# Patient Record
Sex: Female | Born: 2000 | Race: Black or African American | Hispanic: No | Marital: Single | Smoking: Never smoker
Health system: Southern US, Community
[De-identification: ages and names within clinical notes are randomized; demographics above are authoritative.]

## PROBLEM LIST (undated history)

## (undated) DIAGNOSIS — S83501A Sprain of unspecified cruciate ligament of right knee, initial encounter: Secondary | ICD-10-CM

---
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
## 2020-11-05 IMAGING — MR MR KNEE*R* W/O CM
7 series · 40 of 40 positions shown · non-contrast
Comparison: None.

CLINICAL DATA: Right knee pain and swelling after basketball injury
5 days ago

EXAM:
MRI OF THE RIGHT KNEE WITHOUT CONTRAST
TECHNIQUE: Multiplanar, multisequence MR imaging of the knee was performed. No
intravenous contrast was administered.

[Series 3: T2 fat-sat · axial · 4.0mm · 0.53mm/px · z∈[-61,+108]mm · 6 of 35 slices shown (1 of 3)]
[im 1/35]
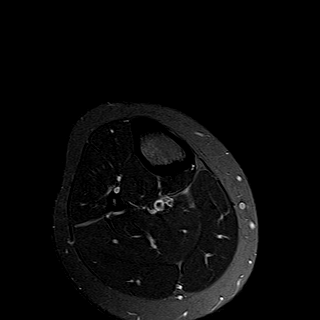
[im 7/35]
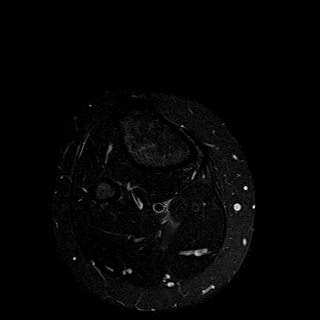
[im 14/35]
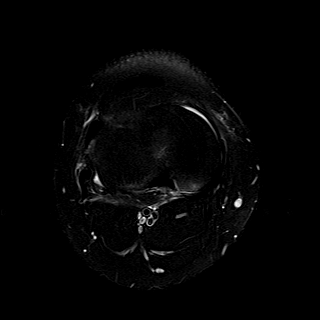
[im 21/35]
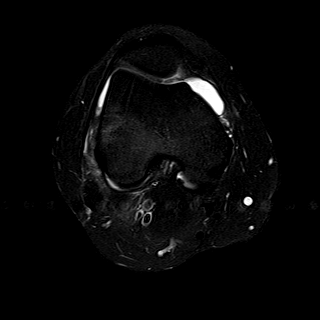
[im 28/35]
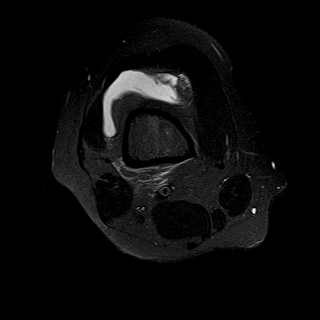
[im 35/35]
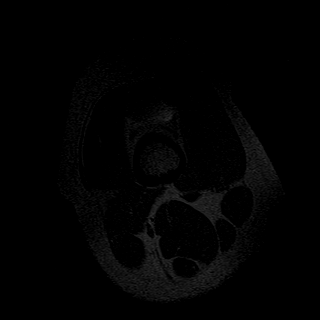

[Series 4: T1 · coronal · 4.0mm · 0.62mm/px · 6 of 31 slices shown]
[im 1/31]
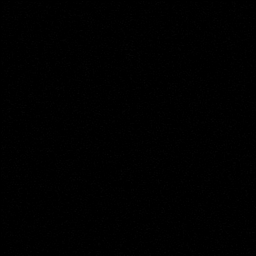
[im 7/31]
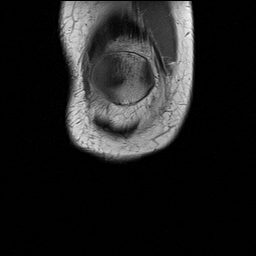
[im 13/31]
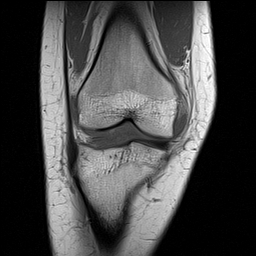
[im 19/31]
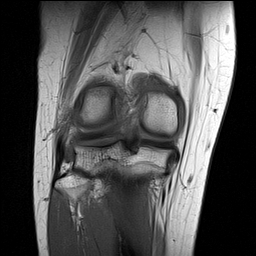
[im 25/31]
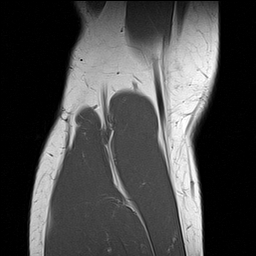
[im 31/31]
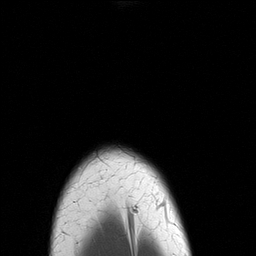

[Series 5: T2 fat-sat · coronal · 4.0mm · 0.62mm/px · 6 of 31 slices shown (2 of 3)]
[im 1/31]
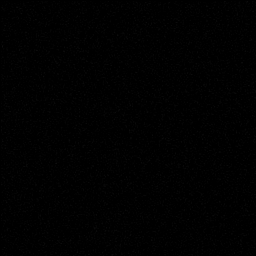
[im 7/31]
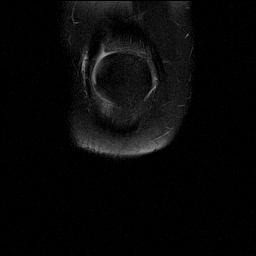
[im 13/31]
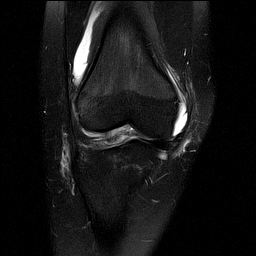
[im 19/31]
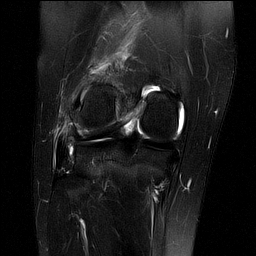
[im 25/31]
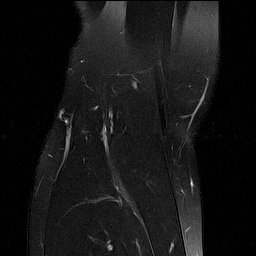
[im 31/31]
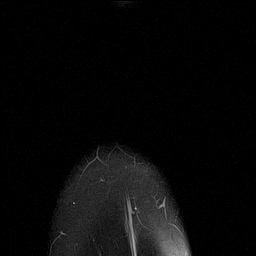

[Series 6: PD fat-sat · coronal · 4.0mm · 0.62mm/px · 6 of 31 slices shown (1 of 3)]
[im 1/31]
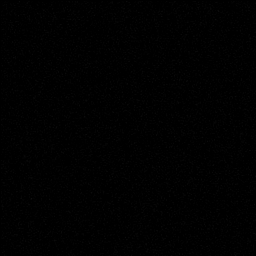
[im 7/31]
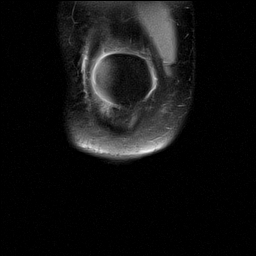
[im 13/31]
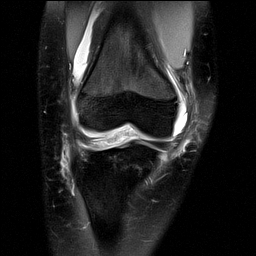
[im 19/31]
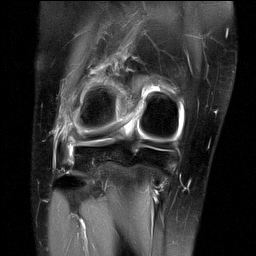
[im 25/31]
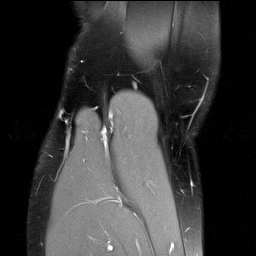
[im 31/31]
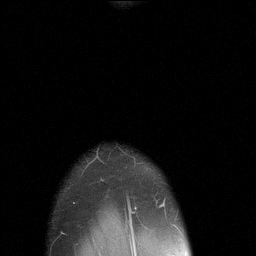

[Series 7: PD fat-sat · sagittal · 3.0mm · 0.62mm/px · 6 of 34 slices shown (2 of 3)]
[im 1/34]
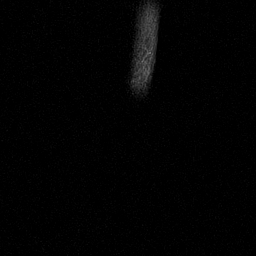
[im 7/34]
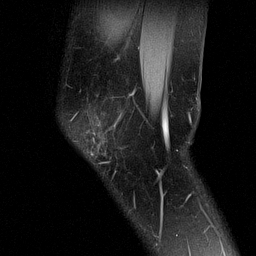
[im 14/34]
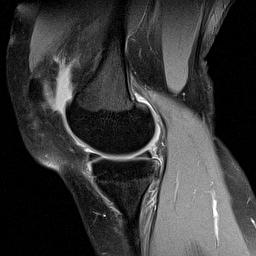
[im 20/34]
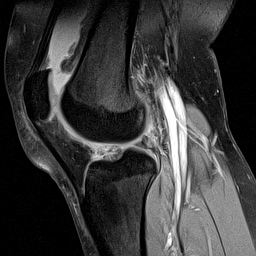
[im 27/34]
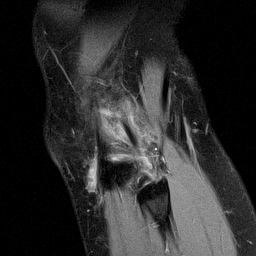
[im 34/34]
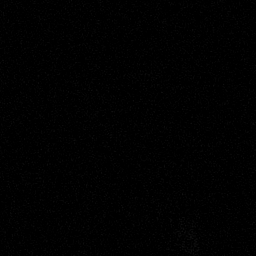

[Series 8: T2 fat-sat · sagittal · 3.0mm · 0.62mm/px · 6 of 34 slices shown (3 of 3)]
[im 1/34]
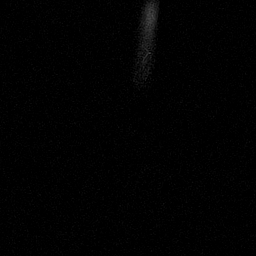
[im 7/34]
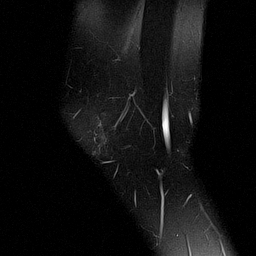
[im 14/34]
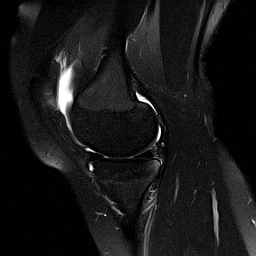
[im 20/34]
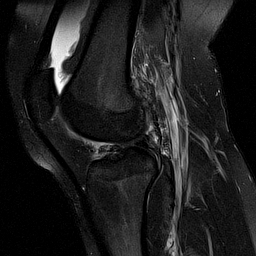
[im 27/34]
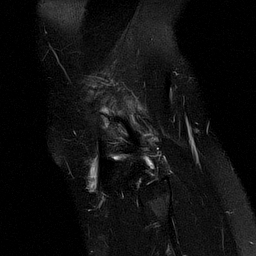
[im 34/34]
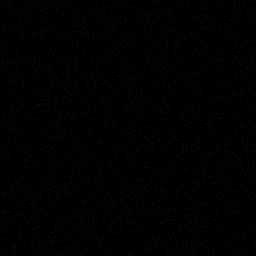

[Series 9: PD fat-sat · oblique · 2.0mm · 0.62mm/px · 4 of 19 slices shown (3 of 3)]
[im 1/19]
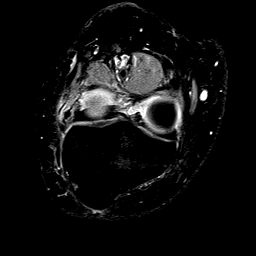
[im 7/19]
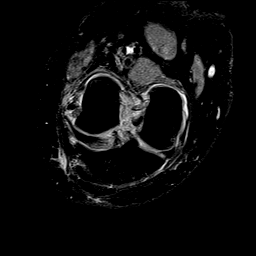
[im 13/19]
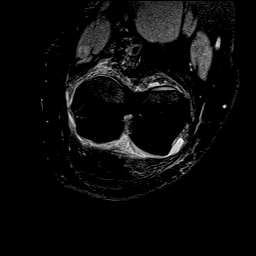
[im 19/19]
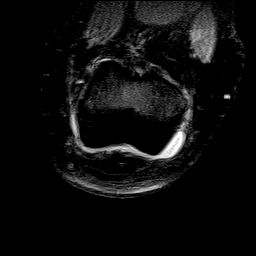

[40 of 40 positions shown; findings below may reference images not displayed]

FINDINGS: MENISCI

Medial meniscus:  Intact.

Lateral meniscus:  Intact.

LIGAMENTS

Cruciates:  Complete midsubstance rupture of the ACL. Intact PCL.

Collaterals: Thickened, irregular appearance of the fibular
collateral ligament at its femoral attachment with partial thickness
tearing. Lateral collateral ligament complex is otherwise intact.
Intact MCL.

CARTILAGE

Patellofemoral:  No chondral defect.

Medial:  No chondral defect.

Lateral:  No chondral defect.

Joint:  Large knee joint effusion. Edema within Hoffa's fat.

Popliteal Fossa:  No Baker cyst. Intact popliteus tendon.

Extensor Mechanism: Intact quadriceps tendon and patellar tendon.
Mild proximal and distal patellar tendinosis.

Bones: Pivot shift bone marrow contusions of the lateral femoral
condyle and posterior lateral tibial plateau. There is also mild
focal edema at the posterior aspect medial tibial plateau. No
fracture is seen. No dislocation. No suspicious bone lesion.

Other: None.
IMPRESSION: 1. Complete midsubstance rupture of the ACL with typical pivot shift
bone marrow contusions.
2. Grade 2 sprain of the fibular collateral ligament.
3. Large knee joint effusion.
4. Mild patellar tendinosis without tear.
